# Patient Record
Sex: Female | Born: 1975 | Race: Black or African American | Hispanic: No | Marital: Single | Smoking: Never smoker
Health system: Southern US, Community
[De-identification: ages and names within clinical notes are randomized; demographics above are authoritative.]

## PROBLEM LIST (undated history)

## (undated) DIAGNOSIS — D649 Anemia, unspecified: Secondary | ICD-10-CM

---

## 2012-03-27 ENCOUNTER — Encounter (HOSPITAL_COMMUNITY): Payer: Self-pay | Admitting: Emergency Medicine

## 2012-03-27 ENCOUNTER — Emergency Department (HOSPITAL_COMMUNITY)
Admission: EM | Admit: 2012-03-27 | Discharge: 2012-03-27 | Disposition: A | Discharge status: Home / Self Care | Payer: PRIVATE HEALTH INSURANCE | Attending: Emergency Medicine | Admitting: Emergency Medicine

## 2012-03-27 DIAGNOSIS — A499 Bacterial infection, unspecified: Secondary | ICD-10-CM | POA: Insufficient documentation

## 2012-03-27 DIAGNOSIS — Z202 Contact with and (suspected) exposure to infections with a predominantly sexual mode of transmission: Secondary | ICD-10-CM

## 2012-03-27 DIAGNOSIS — Z9189 Other specified personal risk factors, not elsewhere classified: Secondary | ICD-10-CM | POA: Insufficient documentation

## 2012-03-27 DIAGNOSIS — B379 Candidiasis, unspecified: Secondary | ICD-10-CM

## 2012-03-27 DIAGNOSIS — B49 Unspecified mycosis: Secondary | ICD-10-CM | POA: Insufficient documentation

## 2012-03-27 DIAGNOSIS — N760 Acute vaginitis: Secondary | ICD-10-CM | POA: Insufficient documentation

## 2012-03-27 DIAGNOSIS — B9689 Other specified bacterial agents as the cause of diseases classified elsewhere: Secondary | ICD-10-CM | POA: Insufficient documentation

## 2012-03-27 LAB — URINALYSIS, ROUTINE W REFLEX MICROSCOPIC
Bilirubin Urine: NEGATIVE
Glucose, UA: NEGATIVE mg/dL
Hgb urine dipstick: NEGATIVE
Ketones, ur: NEGATIVE mg/dL
Nitrite: NEGATIVE
Protein, ur: NEGATIVE mg/dL
Specific Gravity, Urine: 1.019 (ref 1.005–1.030)
Urobilinogen, UA: 0.2 mg/dL (ref 0.0–1.0)
pH: 5.5 (ref 5.0–8.0)

## 2012-03-27 LAB — POCT PREGNANCY, URINE: Preg Test, Ur: NEGATIVE

## 2012-03-27 LAB — WET PREP, GENITAL: Trich, Wet Prep: NONE SEEN

## 2012-03-27 LAB — URINE MICROSCOPIC-ADD ON

## 2012-03-27 MED ORDER — LIDOCAINE HCL (PF) 1 % IJ SOLN
INTRAMUSCULAR | Status: AC
  Administered 2012-03-27: 08:00:00
  Filled 2012-03-27: qty 5

## 2012-03-27 MED ORDER — CEFTRIAXONE SODIUM 250 MG IJ SOLR
250.0000 mg | Freq: Once | INTRAMUSCULAR | Status: AC
  Administered 2012-03-27: 250 mg via INTRAMUSCULAR
  Filled 2012-03-27: qty 250

## 2012-03-27 MED ORDER — AZITHROMYCIN 250 MG PO TABS
1000.0000 mg | ORAL_TABLET | Freq: Once | ORAL | Status: AC
  Administered 2012-03-27: 1000 mg via ORAL
  Filled 2012-03-27: qty 4

## 2012-03-27 MED ORDER — FLUCONAZOLE 200 MG PO TABS: 200.0000 mg | ORAL_TABLET | Freq: Every day | ORAL | Status: AC

## 2012-03-27 MED ORDER — IBUPROFEN 200 MG PO TABS
600.0000 mg | ORAL_TABLET | Freq: Once | ORAL | Status: AC
  Administered 2012-03-27: 600 mg via ORAL
  Filled 2012-03-27: qty 3

## 2012-03-27 MED ORDER — HYDROCODONE-ACETAMINOPHEN 5-325 MG PO TABS: 1.0000 | ORAL_TABLET | Freq: Four times a day (QID) | ORAL | Status: AC | PRN

## 2012-03-27 MED ORDER — METRONIDAZOLE 500 MG PO TABS
2000.0000 mg | ORAL_TABLET | Freq: Once | ORAL | Status: AC
  Administered 2012-03-27: 2000 mg via ORAL
  Filled 2012-03-27: qty 4

## 2012-03-27 NOTE — ED Provider Notes (Signed)
History     CSN: [id number]  Arrival date & time 03/27/12  1478   First MD Initiated Contact with Patient 03/27/12 [phone]      Chief Complaint  Patient presents with  . Vaginal Discharge  . Vaginal Itching  . Anal Itching    (Consider location/radiation/quality/duration/timing/severity/associated sxs/prior treatment) HPI Comments: Pt w a hx of genial herpes presents to the ED w cc of vaginal irritation and discharge. Onset of symptoms began early Friday night. Described as green/yellow dc w erythematous vulva and rectal area. "the whole area is inflamed and red." Associated with some mild bleeding Friday night, urinary frequency and dysparunia. LMP June 23, normal. Pt denies abdominal pain, nausea, vomiting, fevers, nights sweats chills, back pain, dysuria, hematuria. Pt reports having unprotected sex with her bf who has had unprotected intercourse with other females.  Patient is a 36 y.o. female presenting with vaginal discharge and vaginal itching. The history is provided by the patient.  Vaginal Discharge  Vaginal Itching    History reviewed. No pertinent past medical history.  History reviewed. No pertinent past surgical history.  History reviewed. No pertinent family history.  History  Substance Use Topics  . Smoking status: Never Smoker   . Smokeless tobacco: Not on file  . Alcohol Use: Yes    OB History    Grav Para Term Preterm Abortions TAB SAB Ect Mult Living   3 3              Review of Systems  Genitourinary: Positive for vaginal discharge.    Allergies  Review of patient's allergies indicates no known allergies.  Home Medications   Current Outpatient Rx  Name Route Sig Dispense Refill  . VALACYCLOVIR HCL 500 MG PO TABS Oral Take 500 mg by mouth daily.      BP 111/69  Pulse 81  Temp 98.5 F (36.9 C) (Oral)  Resp 14  SpO2 97%  Physical Exam  Nursing note and vitals reviewed. Constitutional: She is oriented to person, place, and time. She  appears well-developed and well-nourished. No distress.  HENT:  Head: Normocephalic and atraumatic.  Eyes: Conjunctivae and EOM are normal.  Neck: Normal range of motion.  Pulmonary/Chest: Effort normal.  Genitourinary:       Exam performed by Jaci Carrel,  exam chaperoned Date: 03/27/2012 Pelvic exam: normal external genitalia without evidence of trauma. VAGINA/Vulva: erythematous with discharge, no lesions. CERVIX: cervical motion tenderness absent, cervical os with purulent discharge; vaginal discharge - copious and creamy, Wet prep and DNA probe for chlamydia and GC obtained.   ADNEXA: normal adnexa in size, nontender and no masses UTERUS: uterus is normal size, shape, consistency and nontender.    Musculoskeletal: Normal range of motion.  Neurological: She is alert and oriented to person, place, and time.  Skin: Skin is warm and dry. No rash noted. She is not diaphoretic.  Psychiatric: She has a normal mood and affect. Her behavior is normal.    ED Course  Procedures (including critical care time)  Labs Reviewed  WET PREP, GENITAL - Abnormal; Notable for the following:    Yeast Wet Prep HPF POC MODERATE (*)     Clue Cells Wet Prep HPF POC MODERATE (*)     WBC, Wet Prep HPF POC MODERATE (*)     All other components within normal limits  POCT PREGNANCY, URINE  GC/CHLAMYDIA PROBE AMP, GENITAL  URINALYSIS, ROUTINE W REFLEX MICROSCOPIC   No results found.   No diagnosis found.  MDM  STD Patient to be discharged with instructions to follow up with OBGYN. Discussed importance of using protection when sexually active. Pt understands that they have GC/Chlamydia cultures pending and that they will need to inform all sexual partners if results return positive. Pt has been treated prophylacticly with azithromycin and rocephin due to pts history, pelvic exam, and wet prep with increased WBCs. Pt not concerning for PID because hemodynamically stable and no cervical motion  tenderness on pelvic exam. Pt has also been treated with flagyl for Bacterial Vaginosis. Pt has been advised to not drink alcohol while on this medication. Pt will also be treated with diflucan for yeast seen on wet prep.          Jaci Carrel, [place] 03/27/12 [phone]

## 2012-03-27 NOTE — ED Notes (Signed)
Pt states thick yellow vaginal discharge x 2 weeks, swelling and burning sensation

## 2012-03-27 NOTE — ED Provider Notes (Signed)
Medical screening examination/treatment/procedure(s) were performed by non-physician practitioner and as supervising physician I was immediately available for consultation/collaboration.  Cheri Guppy, MD 03/27/12 1524

## 2012-03-27 NOTE — ED Notes (Signed)
Pt reports unprotected sex with boyfriend who admitted to her that he was having unprotected sex with another female

## 2012-03-28 LAB — GC/CHLAMYDIA PROBE AMP, GENITAL
Chlamydia, DNA Probe: NEGATIVE
GC Probe Amp, Genital: NEGATIVE

## 2012-05-06 ENCOUNTER — Ambulatory Visit (INDEPENDENT_AMBULATORY_CARE_PROVIDER_SITE_OTHER): Payer: PRIVATE HEALTH INSURANCE | Admitting: Family Medicine

## 2012-05-06 ENCOUNTER — Ambulatory Visit: Payer: PRIVATE HEALTH INSURANCE

## 2012-05-06 VITALS — BP 95/60 | HR 66 | Temp 98.1°F | Resp 16 | Ht 66.0 in | Wt 152.0 lb

## 2012-05-06 DIAGNOSIS — M542 Cervicalgia: Secondary | ICD-10-CM

## 2012-05-06 DIAGNOSIS — M79603 Pain in arm, unspecified: Secondary | ICD-10-CM

## 2012-05-06 DIAGNOSIS — G5600 Carpal tunnel syndrome, unspecified upper limb: Secondary | ICD-10-CM

## 2012-05-06 DIAGNOSIS — R202 Paresthesia of skin: Secondary | ICD-10-CM

## 2012-05-06 DIAGNOSIS — M79609 Pain in unspecified limb: Secondary | ICD-10-CM

## 2012-05-06 DIAGNOSIS — G5601 Carpal tunnel syndrome, right upper limb: Secondary | ICD-10-CM

## 2012-05-06 DIAGNOSIS — R209 Unspecified disturbances of skin sensation: Secondary | ICD-10-CM

## 2012-05-06 MED ORDER — NAPROXEN 500 MG PO TABS: 500.0000 mg | ORAL_TABLET | Freq: Two times a day (BID) | ORAL | Status: DC

## 2012-05-06 NOTE — Patient Instructions (Addendum)
1. Arm pain  DG Cervical Spine Complete  2. Neck pain    3. Paresthesias in right hand    4. Carpal tunnel syndrome of right wrist  naproxen (NAPROSYN) 500 MG tablet   Carpal Tunnel Syndrome The carpal tunnel is a narrow hollow area in the wrist. It is formed by the wrist bones and ligaments. Nerves, blood vessels, and tendons (cord like structures which attach muscle to bone) on the palm side (the side of your hand in the direction your fingers bend) of your hand pass through the carpal tunnel. Repeated wrist motion or certain diseases may cause swelling within the tunnel. (That is why these are called repetitive trauma (damage caused by over use) disorders. It is also a common problem in late pregnancy.) This swelling pinches the main nerve in the wrist (median nerve) and causes the painful condition called carpal tunnel syndrome. A feeling of "pins and needles" may be noticed in the fingers or hand; however, the entire arm may ache from this condition. Carpal tunnel syndrome may clear up by itself. Cortisone injections may help. Sometimes, an operation may be needed to free the pinched nerve. An electromyogram (a type of test) may be needed to confirm this diagnosis (learning what is wrong). This is a test which measures nerve conduction. The nerve conduction is usually slowed in a carpal tunnel syndrome. HOME CARE INSTRUCTIONS   If your caregiver prescribed medication to help reduce swelling, take as directed.   If you were given a splint to keep your wrist from bending, use it as instructed. It is important to wear the splint at night. Use the splint for as long as you have pain or numbness in your hand, arm or wrist. This may take 1 to 2 months.   If you have pain at night, it may help to rub or shake your hand, or elevate your hand above the level of your heart (the center of your chest).   It is important to give your wrist a rest by stopping the activities that are causing the problem. If  your symptoms (problems) are work-related, you may need to talk to your employer about changing to a job that does not require using your wrist.   Only take over-the-counter or prescription medicines for pain, discomfort, or fever as directed by your caregiver.   Following periods of extended use, particularly strenuous use, apply an ice pack wrapped in a towel to the anterior (palm) side of the affected wrist for 20 to 30 minutes. Repeat as needed three to four times per day. This will help reduce the swelling.   Follow all instructions for follow-up with your caregiver. This includes any orthopedic referrals, physical therapy, and rehabilitation. Any delay in obtaining necessary care could result in a delay or failure of your condition to heal.  SEEK IMMEDIATE MEDICAL CARE IF:   You are still having pain and numbness following a week of treatment.   You develop new, unexplained symptoms.   Your current symptoms are getting worse and are not helped or controlled with medications.  MAKE SURE YOU:   Understand these instructions.   Will watch your condition.   Will get help right away if you are not doing well or get worse.  Document Released: 08/27/2000 Document Revised: 08/19/2011 Document Reviewed: 07/16/2011 [facility] Patient Information 2012 [place], [place].

## 2012-05-06 NOTE — Progress Notes (Signed)
Subjective:    Patient ID: Patricia Grimes, female    DOB: 30-Apr-1976, 36 y.o.   MRN: [id number]  HPIThis 36 y.o. female presents for evaluation of R arm pain.  Onset 10 days ago.  Noticed it one morning upon awakening and R hand was asleep.  After a while tingling improved.  Started in hand and now radiating into hand.  Also had numbness in toes on R side 1st toe and 2nd, 3rd toe last week; no recurrence since last week.  +mild neck pain with symptoms recently in past few days.  Started in hand and radiated into elbow and now moving into shoulder and neck.  Weakness due to pain in hand R.  Supervisor at work had pt squeeze her; grip was strong.  Picking up objects without weakness.  No similar symptoms in past.  No overuse.  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  Constant intermittent tingling; nighttime tingling intermittent.  All fingers get tingling yet mostly in thumb.  Took generic OTC Ibuprofen without relief; also took Naproxen without relief.   LMP last week;normal.   PMH: HSV genital, regular menses Psurg:  None Allergies: none Medications: Valtrex G3P3 Social: sexually active; +condoms; does not see partner a lot; in school; CNA at [employer].   Family:  M -- Alive 75; hypercholesterolemia   F:  Alive; unknown.  Siblings: healthy  Review of Systems  Constitutional: Negative for fever, chills and fatigue.  HENT: Positive for neck pain and neck stiffness. Negative for tinnitus.   Eyes: Negative for visual disturbance.  Musculoskeletal: Positive for myalgias, back pain and arthralgias. Negative for joint swelling.  Skin: Negative for color change, pallor and rash.  Neurological: Positive for numbness. Negative for dizziness, tremors, syncope, facial asymmetry, weakness and headaches.        No past medical history on file.  No past surgical history on file.  Prior to Admission medications   Medication Sig Start Date End Date Taking? Authorizing Provider    valACYclovir (VALTREX) 500 MG tablet Take 500 mg by mouth daily.   Yes Historical Provider, MD  naproxen (NAPROSYN) 500 MG tablet Take 1 tablet (500 mg total) by mouth 2 (two) times daily with a meal. 05/06/12 05/06/13  Ethelda Chick, MD    No Known Allergies  History   Social History  . Marital Status: Single    Spouse Name: N/A    Number of Children: N/A  . Years of Education: N/A   Occupational History  . Not on file.   Social History Main Topics  . Smoking status: Never Smoker   . Smokeless tobacco: Not on file  . Alcohol Use: Yes  . Drug Use: No  . Sexually Active: Yes    Birth Control/ Protection: None   Other Topics Concern  . Not on file   Social History Narrative  . No narrative on file    No family history on file.  Objective:   Physical Exam  Nursing note and vitals reviewed. Constitutional: She is oriented to person, place, and time. She appears well-developed and well-nourished. No distress.  HENT:  Head: Normocephalic and atraumatic.  Eyes: Conjunctivae and EOM are normal. Pupils are equal, round, and reactive to light.  Neck: Normal range of motion. Neck supple. No thyromegaly present.  Cardiovascular: Normal rate, regular rhythm, normal heart sounds and intact distal pulses.   Pulmonary/Chest: Effort normal and breath sounds normal.  Musculoskeletal:       CERVICAL SPINE: FULL ROM WITHOUT  PAIN; +TTP R TRAPEZIUS REGION; NO MIDLINE TTP CERVICAL SPINE; FULL ROM B SHOULDERS WITHOUT PAIN OR LIMITATION; MOTOR 5/5 BUE; NORMAL PRONATION/SUPINATION RUE; FULL ROM R WRIST; FULL ROM R THUMB; NON-TENDER R HAND. GRIP 5/5.   Lymphadenopathy:    She has no cervical adenopathy.  Neurological: She is alert and oriented to person, place, and time. She has normal reflexes. She exhibits normal muscle tone.       SENSATION INTACT RUE.  Skin: She is not diaphoretic.      [facility] reading (PRIMARY) by  Dr. Katrinka Blazing.  C-spine films:  Spurring/degenerative changes in C5-6.  No  acute changes.   Assessment & Plan:   1. Arm pain  DG Cervical Spine Complete  2. Neck pain    3. Paresthesias in right hand    4. Carpal tunnel syndrome of right wrist  naproxen (NAPROSYN) 500 MG tablet     1.  Arm Pain: New.  Secondary to Carpal Tunnel Syndrome. 2.  Neck Pain:  New.  Appears to be separate issue from arm pain.  S/p cervical spine films with degenerative changes. 3.  Paresthesias Hand R: New. Median nerve distribution suggestive of carpal tunnel syndrome 4.  Carpal Tunnel Syndrome R: New.  Recommend rest, ice, NSAIDs. Provided with wrist splint to use qhs scheduled and PRN during the daytime with work to limit use of R wrist.  No limitations to activities at this time.  If worsens, may warrant referral for NCS and ortho evaluation; pt expressed understanding.  Follow-up one month if no improvement.  Also recommend continuing daily MVI for B6 supplementation.  Meds ordered this encounter  Medications  . naproxen (NAPROSYN) 500 MG tablet    Sig: Take 1 tablet (500 mg total) by mouth 2 (two) times daily with a meal.    Dispense:  30 tablet    Refill:  0

## 2012-05-07 NOTE — Progress Notes (Signed)
Reviewed and agree.

## 2012-08-12 ENCOUNTER — Encounter (HOSPITAL_COMMUNITY): Payer: Self-pay | Admitting: Radiology

## 2012-08-12 ENCOUNTER — Emergency Department (HOSPITAL_COMMUNITY)
Admission: EM | Admit: 2012-08-12 | Discharge: 2012-08-12 | Disposition: A | Discharge status: Home / Self Care | Payer: Self-pay | Attending: Emergency Medicine | Admitting: Emergency Medicine

## 2012-08-12 ENCOUNTER — Ambulatory Visit: Payer: Self-pay

## 2012-08-12 DIAGNOSIS — L678 Other hair color and hair shaft abnormalities: Secondary | ICD-10-CM | POA: Insufficient documentation

## 2012-08-12 DIAGNOSIS — L738 Other specified follicular disorders: Secondary | ICD-10-CM | POA: Insufficient documentation

## 2012-08-12 DIAGNOSIS — L739 Follicular disorder, unspecified: Secondary | ICD-10-CM

## 2012-08-12 MED ORDER — DOXYCYCLINE HYCLATE 100 MG PO CAPS: 100.0000 mg | ORAL_CAPSULE | Freq: Two times a day (BID) | ORAL | Status: DC

## 2012-08-12 NOTE — ED Notes (Signed)
And prescription strength fungal shampoo.

## 2012-08-12 NOTE — ED Notes (Signed)
Pt presents with rash to left occiptal lobe X 1 month

## 2012-08-12 NOTE — ED Provider Notes (Signed)
History     CSN: [id number]  Arrival date & time 08/12/12  1015   First MD Initiated Contact with Patient 08/12/12 1027      Chief Complaint  Patient presents with  . Rash    (Consider location/radiation/quality/duration/timing/severity/associated sxs/prior treatment) HPI  The patient presents to the ED with a 1 month history of a scalp lesion.  The patient reports the lesions started one month ago and has increased in size. She denies new shampoo, condition,hair products, or change of hair dressers.  She complains of pruritis and drainage from the lesion. She reports using ketoconazole over the past month without relief.  She reports brading her hair and wears wigs but has been unable to wear a wig for one month.  Patient denies fever, nausea, vomiting, headache, or weakness.  History reviewed. No pertinent past medical history.  History reviewed. No pertinent past surgical history.  History reviewed. No pertinent family history.  History  Substance Use Topics  . Smoking status: Never Smoker   . Smokeless tobacco: Not on file  . Alcohol Use: Yes    OB History    Grav Para Term Preterm Abortions TAB SAB Ect Mult Living   3 3              Review of Systems All other systems negative except as documented in the HPI. All pertinent positives and negatives as reviewed in the HPI.  Allergies  Review of patient's allergies indicates no known allergies.  Home Medications  No current outpatient prescriptions on file.  BP 116/69  Temp 98.1 F (36.7 C) (Oral)  SpO2 100%  Physical Exam  Nursing note and vitals reviewed. Constitutional: She is oriented to person, place, and time. She appears well-developed and well-nourished.  HENT:  Head: Normocephalic and atraumatic.  Nose: Nose normal.  Mouth/Throat: Oropharynx is clear and moist.  Neck: Normal range of motion. Neck supple.  Pulmonary/Chest: Effort normal.  Neurological: She is alert and oriented to person, place,  and time.  Skin: Rash noted. There is erythema.       5cm x 5cm Erythremic lesion on superior L Scalp with excoriations and 2 pustules with multiple honey colored crusting along superior boarder.     ED Course  Procedures (including critical care time)  There were pustular like areas in the scalp. Will treat for folliculitis and refer to derm. This could be a tinea but there could be a secondary infection. Told to return here as needed.   MDM          Carlyle Dolly, PA-C 08/13/12 [phone]

## 2012-08-13 NOTE — ED Provider Notes (Signed)
Medical screening examination/treatment/procedure(s) were performed by non-physician practitioner and as supervising physician I was immediately available for consultation/collaboration.  Hadyn Azer, MD 08/13/12 0705 

## 2012-11-01 IMAGING — CR DG CERVICAL SPINE COMPLETE 4+V
5 series · 5 of 5 positions shown · non-contrast
Comparison: None.

CLINICAL DATA: Neck/arm pain

CERVICAL SPINE - COMPLETE 4+ VIEW

[lpo]
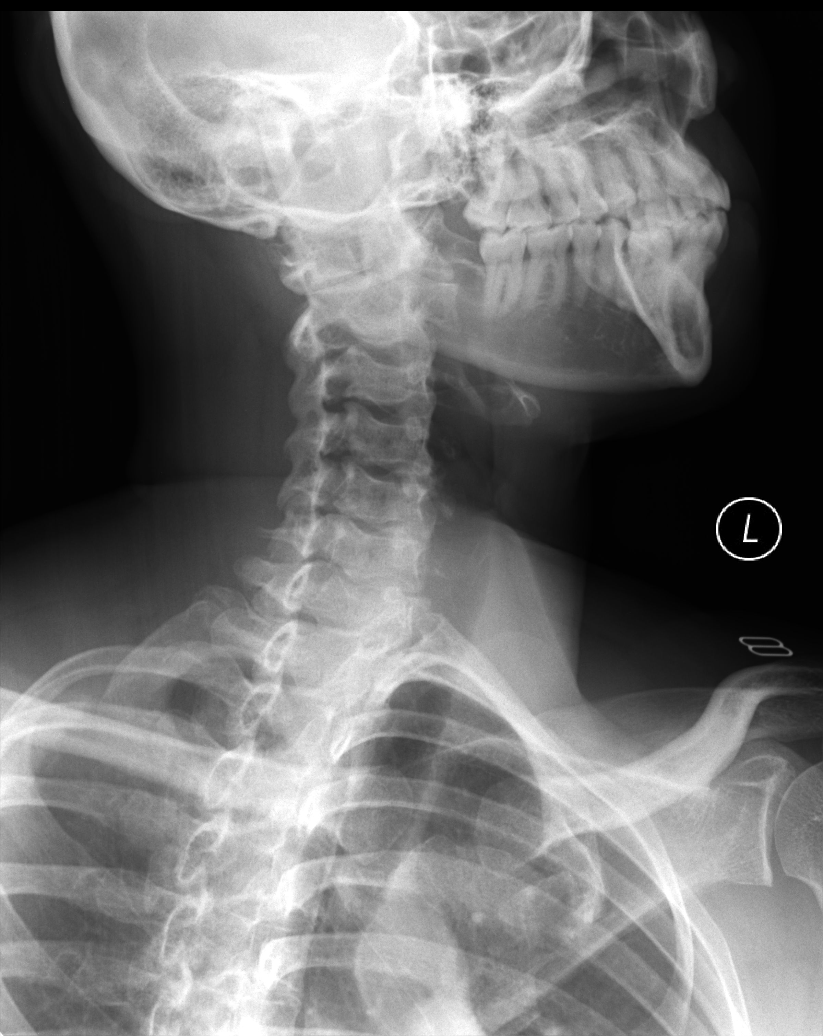

[lateral]
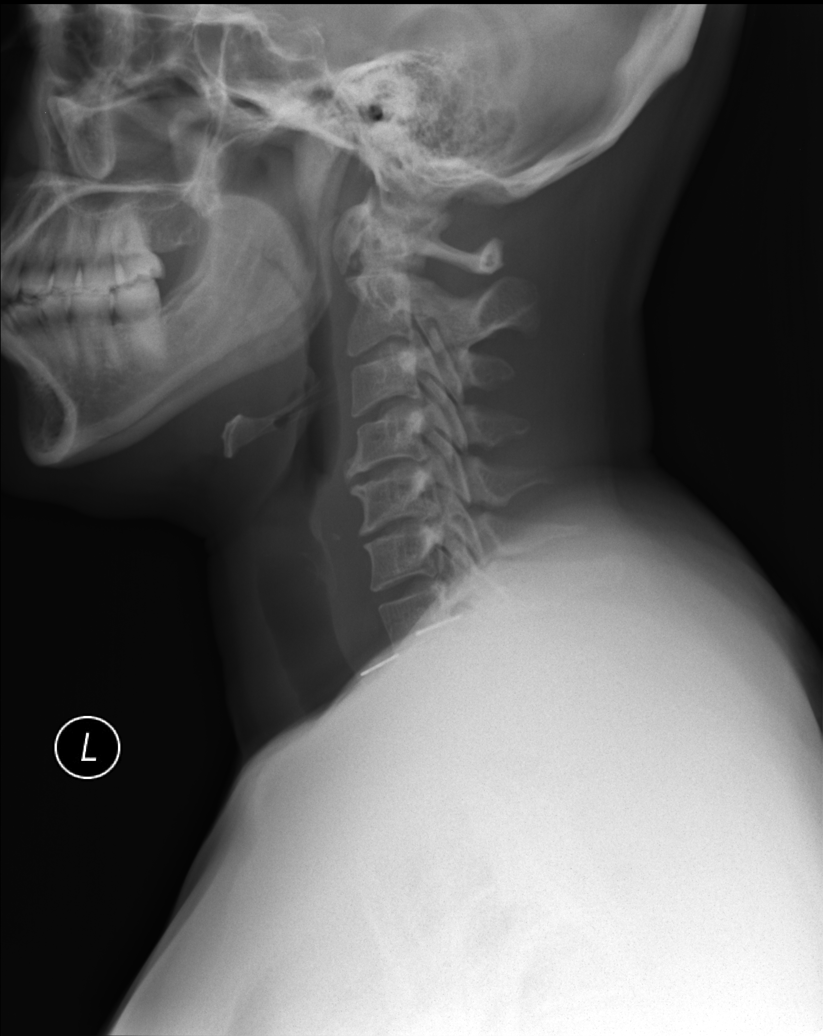

[rpo]
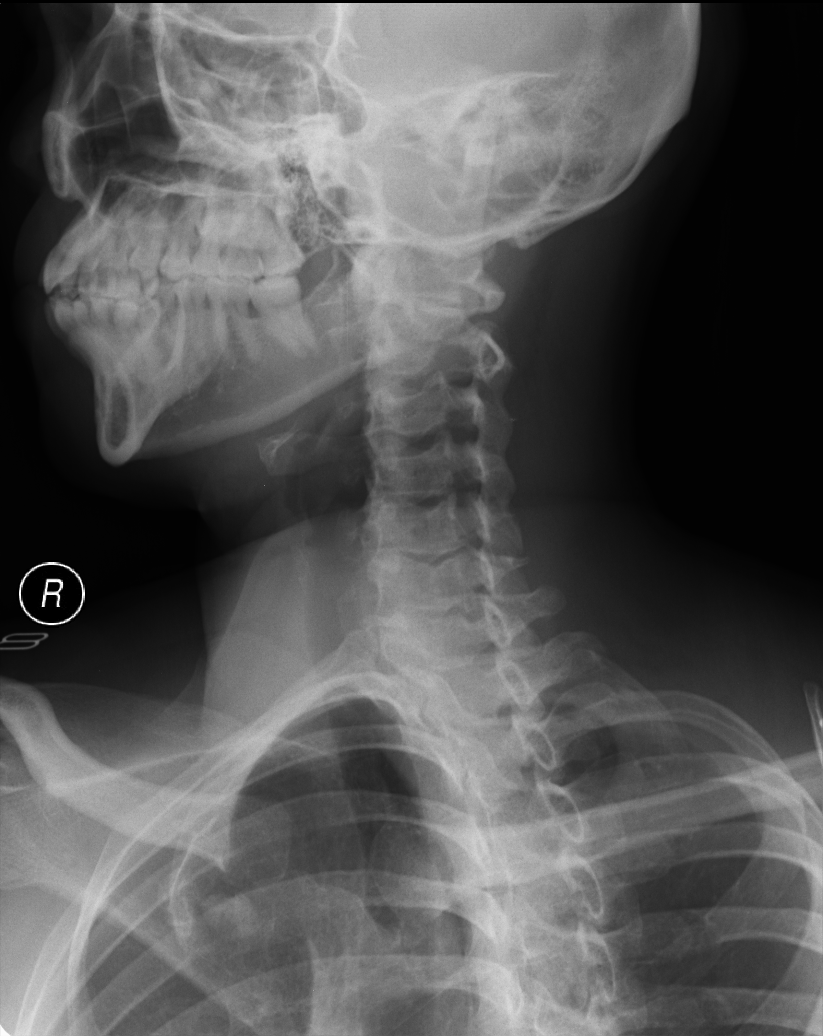

[AP]
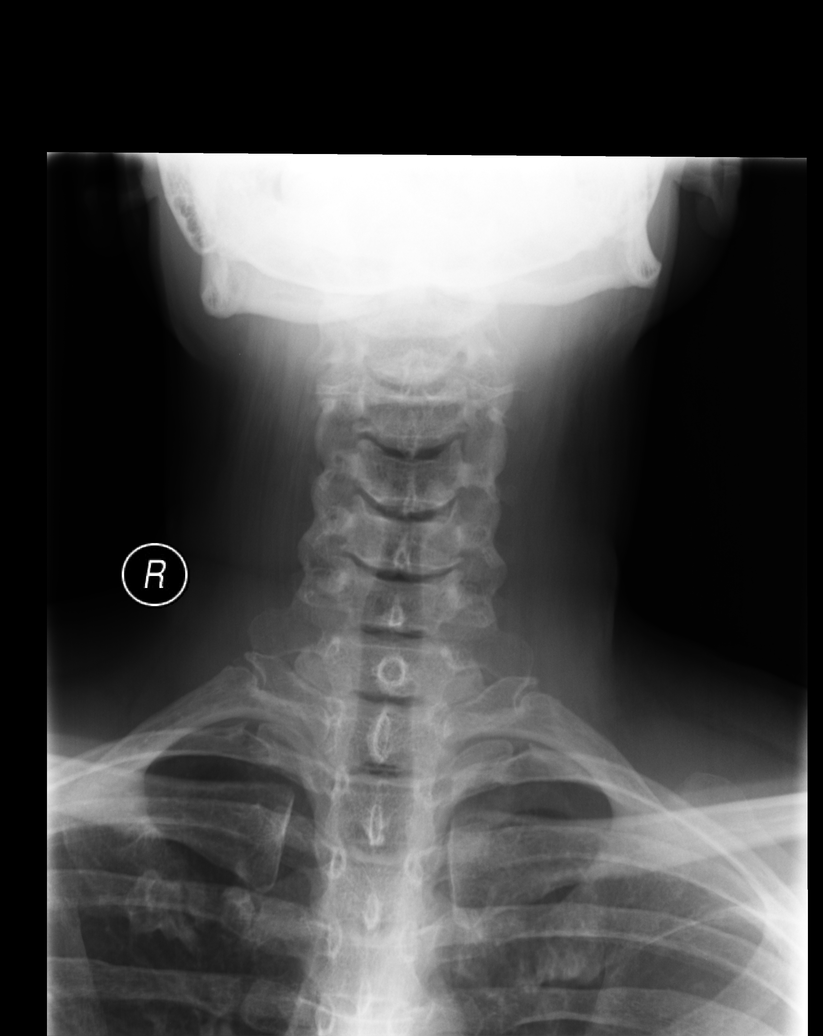

[ap open mouth]
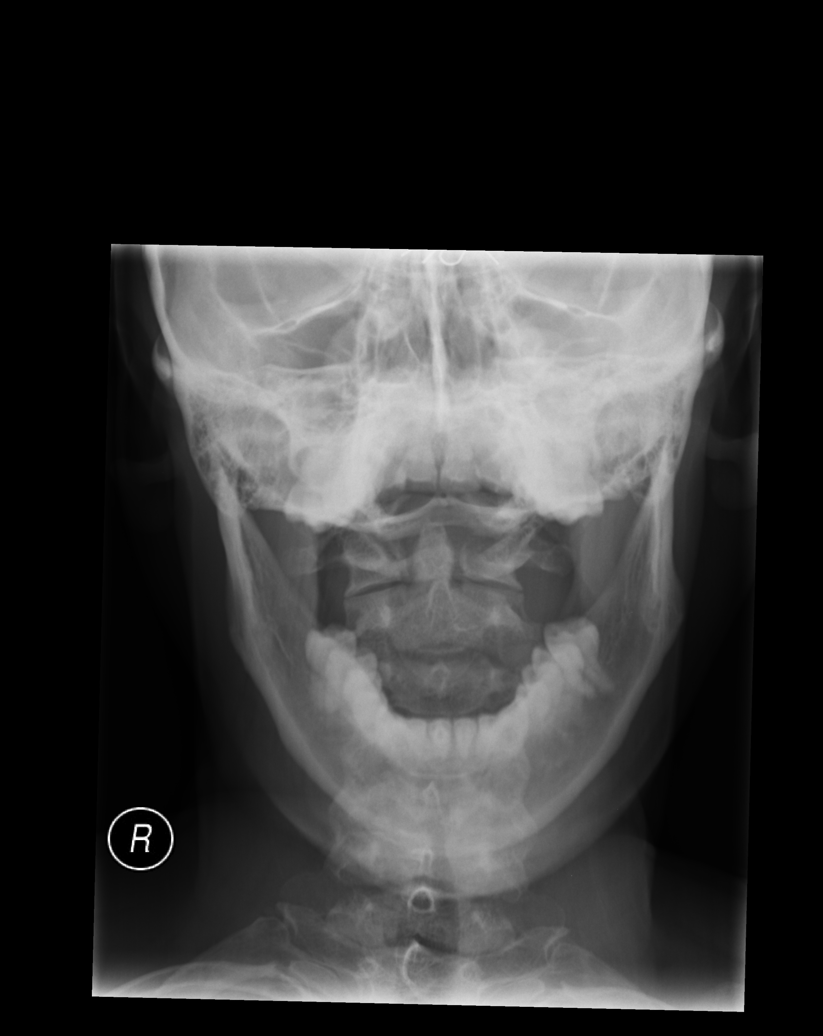

[5 of 5 positions shown; findings below may reference images not displayed]

FINDINGS: Cervical spine is visualized to the bottom of C7 on the
lateral view.

No evidence of fracture or dislocation.  The vertebral body heights
are maintained.  The dens appears intact.  Lateral masses of C1 are
symmetric.

Moderate degenerative changes, including degenerative spurring at
C1-2.

Visualized lung apices are clear.
IMPRESSION: No evidence of fracture or dislocation.

Moderate multilevel degenerative changes.

Clinically significant discrepancy from primary report, if
provided: None

## 2013-08-24 ENCOUNTER — Ambulatory Visit: Payer: 59

## 2013-08-30 ENCOUNTER — Encounter (HOSPITAL_COMMUNITY): Payer: Self-pay | Admitting: Emergency Medicine

## 2013-08-30 DIAGNOSIS — Z79899 Other long term (current) drug therapy: Secondary | ICD-10-CM | POA: Insufficient documentation

## 2013-08-30 DIAGNOSIS — R071 Chest pain on breathing: Secondary | ICD-10-CM | POA: Insufficient documentation

## 2013-08-30 LAB — CBC
HCT: 35.4 % — ABNORMAL LOW (ref 36.0–46.0)
Hemoglobin: 11.8 g/dL — ABNORMAL LOW (ref 12.0–15.0)
MCH: 29.1 pg (ref 26.0–34.0)
MCHC: 33.3 g/dL (ref 30.0–36.0)
MCV: 87.2 fL (ref 78.0–100.0)
Platelets: 256 10*3/uL (ref 150–400)
RBC: 4.06 MIL/uL (ref 3.87–5.11)
RDW: 13.5 % (ref 11.5–15.5)
WBC: 8.3 10*3/uL (ref 4.0–10.5)

## 2013-08-30 LAB — POCT I-STAT TROPONIN I: Troponin i, poc: 0 ng/mL (ref 0.00–0.08)

## 2013-08-30 NOTE — ED Notes (Signed)
Pt. reports intermittent mid/left chest pain onset yesterday with SOB , denies nausea or diaphoresis .

## 2013-08-31 ENCOUNTER — Emergency Department (HOSPITAL_COMMUNITY)
Admission: EM | Admit: 2013-08-31 | Discharge: 2013-08-31 | Disposition: A | Discharge status: Home / Self Care | Payer: 59 | Attending: Emergency Medicine | Admitting: Emergency Medicine

## 2013-08-31 ENCOUNTER — Emergency Department (HOSPITAL_COMMUNITY): Payer: 59

## 2013-08-31 DIAGNOSIS — R0789 Other chest pain: Secondary | ICD-10-CM

## 2013-08-31 LAB — BASIC METABOLIC PANEL
BUN: 11 mg/dL (ref 6–23)
CO2: 24 mEq/L (ref 19–32)
Calcium: 9 mg/dL (ref 8.4–10.5)
Chloride: 105 mEq/L (ref 96–112)
Creatinine, Ser: 0.77 mg/dL (ref 0.50–1.10)
GFR calc Af Amer: >90 mL/min (ref >90)
GFR calc non Af Amer: >90 mL/min (ref >90)
Glucose, Bld: 98 mg/dL (ref 70–99)
Potassium: 3.8 mEq/L (ref 3.5–5.1)
Sodium: 138 mEq/L (ref 135–145)

## 2013-08-31 LAB — PRO B NATRIURETIC PEPTIDE: Pro B Natriuretic peptide (BNP): 19.2 pg/mL (ref 0–125)

## 2013-08-31 LAB — HCG, SERUM, QUALITATIVE: Preg, Serum: NEGATIVE

## 2013-08-31 LAB — D-DIMER, QUANTITATIVE: D-Dimer, Quant: <0.27 ug/mL-FEU (ref 0.00–0.48)

## 2013-08-31 MED ORDER — HYDROCODONE-ACETAMINOPHEN 5-325 MG PO TABS: 2.0000 | ORAL_TABLET | Freq: Every evening | ORAL | Status: DC | PRN

## 2013-08-31 MED ORDER — HYDROCODONE-ACETAMINOPHEN 5-325 MG PO TABS
2.0000 | ORAL_TABLET | Freq: Once | ORAL | Status: AC
  Administered 2013-08-31: 2 via ORAL
  Filled 2013-08-31: qty 2

## 2013-08-31 NOTE — ED Provider Notes (Signed)
CSN: [id number]     Arrival date & time 08/30/13  2316 History   First MD Initiated Contact with Patient 08/31/13 0048     Chief Complaint  Patient presents with  . Chest Pain   (Consider location/radiation/quality/duration/timing/severity/associated sxs/prior Treatment) HPI 37 year old female takes birth control for the last month, has a couple days moderate constant left anterior chest wall tenderness positional reproducible nonexertional slightly pleuritic nonradiating no associated symptoms. No trauma, leg pain, immobilization, or prior DVT/PE. No treatment PTA. History reviewed. No pertinent past medical history. History reviewed. No pertinent past surgical history. No family history on file. History  Substance Use Topics  . Smoking status: Never Smoker   . Smokeless tobacco: Not on file  . Alcohol Use: Yes   OB History   Grav Para Term Preterm Abortions TAB SAB Ect Mult Living   3 3             Review of Systems 10 Systems reviewed and are negative for acute change except as noted in the HPI. Allergies  Review of patient's allergies indicates no known allergies.  Home Medications   Current Outpatient Rx  Name  Route  Sig  Dispense  Refill  . MINASTRIN 24 FE 1-20 MG-MCG(24) CHEW   Oral   Chew 1 tablet by mouth daily.         Marland Kitchen HYDROcodone-acetaminophen (NORCO) 5-325 MG per tablet   Oral   Take 2 tablets by mouth at bedtime as needed for severe pain.   6 tablet   0    BP 110/70  Pulse 72  Temp(Src) 98.5 F (36.9 C) (Oral)  Resp 20  Wt 164 lb (74.39 kg)  SpO2 99%  LMP 08/22/2013 Physical Exam  Nursing note and vitals reviewed. Constitutional:  Awake, alert, nontoxic appearance.  HENT:  Head: Atraumatic.  Eyes: Right eye exhibits no discharge. Left eye exhibits no discharge.  Neck: Neck supple.  Cardiovascular: Normal rate and regular rhythm.   Pulmonary/Chest: Effort normal and breath sounds normal. No respiratory distress. She has no wheezes. She  has no rales. She exhibits tenderness.  Reproducible tenderness left chest  Abdominal: Soft. Bowel sounds are normal. She exhibits no distension. There is no tenderness. There is no rebound and no guarding.  Musculoskeletal: She exhibits no edema and no tenderness.  Baseline ROM, no obvious new focal weakness.  Neurological:  Mental status and motor strength appears baseline for patient and situation.  Skin: No rash noted.  Psychiatric: She has a normal mood and affect.    ED Course  Procedures (including critical care time) Patient / Family / Caregiver informed of clinical course, understand medical decision-making process, and agree with plan. Labs Review Labs Reviewed  CBC - Abnormal; Notable for the following:    Hemoglobin 11.8 (*)    HCT 35.4 (*)    All other components within normal limits  BASIC METABOLIC PANEL  PRO B NATRIURETIC PEPTIDE  D-DIMER, QUANTITATIVE  HCG, SERUM, QUALITATIVE  POCT I-STAT TROPONIN I   Imaging Review Dg Chest 2 View  08/31/2013   CLINICAL DATA:  Chest pain  EXAM: CHEST  2 VIEW  COMPARISON:  None.  FINDINGS: The heart size and mediastinal contours are within normal limits. Both lungs are clear. No pleural effusion. The visualized skeletal structures are unremarkable.  IMPRESSION: No active cardiopulmonary disease.   Electronically Signed   By: Tiburcio Pea M.D.   On: 08/31/2013 02:11    EKG Interpretation    Date/Time:  Thursday August 30 2013 23:21:29 EST Ventricular Rate:  84 PR Interval:  152 QRS Duration: 78 QT Interval:  382 QTC Calculation: 451 R Axis:   76 Text Interpretation:  Normal sinus rhythm Normal ECG No previous ECGs available Confirmed by [facility]  MD, Lashonne Shull ([id number]) on 08/31/2013 12:50:04 AM            MDM   1. Chest wall pain    I doubt any other EMC precluding discharge at this time including, but not necessarily limited to the following:MI, PE.    Hurman Horn, MD 08/31/13 [phone]

## 2014-07-15 ENCOUNTER — Encounter (HOSPITAL_COMMUNITY): Payer: Self-pay | Admitting: Emergency Medicine

## 2015-03-06 ENCOUNTER — Encounter (HOSPITAL_COMMUNITY): Payer: Self-pay | Admitting: Family Medicine

## 2015-03-06 ENCOUNTER — Emergency Department (INDEPENDENT_AMBULATORY_CARE_PROVIDER_SITE_OTHER)
Admission: EM | Admit: 2015-03-06 | Discharge: 2015-03-06 | Disposition: A | Discharge status: Home / Self Care | Payer: Worker's Compensation | Source: Home / Self Care | Attending: Family Medicine | Admitting: Family Medicine

## 2015-03-06 DIAGNOSIS — S90122A Contusion of left lesser toe(s) without damage to nail, initial encounter: Secondary | ICD-10-CM

## 2015-03-06 NOTE — ED Notes (Signed)
Pt states that a resident at her job rolled over her great toe on the left foot with their wheel chair

## 2015-03-06 NOTE — ED Provider Notes (Signed)
CSN: [id number]     Arrival date & time 03/06/15  1853 History   First MD Initiated Contact with Patient 03/06/15 1918     No chief complaint on file.  (Consider location/radiation/quality/duration/timing/severity/associated sxs/prior Treatment) HPI  06:30 today pt sustained a toe injury. 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 Felt like it pulled the toenail up. Burning sensation. Aleve at 08:00 w/ some improvement. Now with intermittent throbbing. Unable to tolerate closed toed shoes.  Denies any loss of sensation in the toe, falls, bleeding from the toe or toenail.    History reviewed. No pertinent past medical history. History reviewed. No pertinent past surgical history. Family History  Problem Relation Age of Onset  . Asthma Neg Hx   . Cancer Neg Hx   . Diabetes Neg Hx   . Heart failure Neg Hx   . Hyperlipidemia Neg Hx   . Hypertension Neg Hx    History  Substance Use Topics  . Smoking status: Never Smoker   . Smokeless tobacco: Not on file  . Alcohol Use: Yes   OB History    Gravida Para Term Preterm AB TAB SAB Ectopic Multiple Living   3 3             Review of Systems Per HPI with all other pertinent systems negative.   Allergies  Review of patient's allergies indicates no known allergies.  Home Medications   Prior to Admission medications   Medication Sig Start Date End Date Taking? Authorizing Provider  MINASTRIN 24 FE 1-20 MG-MCG(24) CHEW Chew 1 tablet by mouth daily. 08/21/13   Historical Provider, MD   BP 118/84 mmHg  Pulse 78  Temp(Src) 98.3 F (36.8 C) (Oral)  Resp 16  SpO2 97% Physical Exam Physical Exam  Constitutional: oriented to person, place, and time. appears well-developed and well-nourished. No distress.  HENT:  Head: Normocephalic and atraumatic.  Eyes: EOMI. PERRL.  Neck: Normal range of motion.  Cardiovascular: RRR, no m/r/g, 2+ distal pulses,  Pulmonary/Chest: Effort normal and breath  sounds normal. No respiratory distress.  Abdominal: Soft. Bowel sounds are normal. NonTTP, no distension.  Musculoskeletal: Left great toe with intermittent tenderness to palpation along the distal third, lateral aspect of the great toenail pulled away from underlying nailbed but now has thick white appearance and this is likely from an old injury. No evidence of fracture .  Neurological: alert and oriented to person, place, and time.  Skin: Skin is warm. No rash noted. non diaphoretic.  Psychiatric: normal mood and affect. behavior is normal. Judgment and thought content normal.   ED Course  Procedures (including critical care time) Labs Review Labs Reviewed - No data to display  Imaging Review No results found.   MDM   1. Toe contusion, left, initial encounter    Mild contusion. Rest, elevation, ice, anti-inflammatories. Work note provided for 1 night.    Ozella Rocks, MD 03/06/15 [phone]

## 2015-03-06 NOTE — Discharge Instructions (Signed)
You bruised your great toe.  Please either take 2 Aleve twice a day or 800mg  of ibuprofen every 6-8hrs.  Please rest your foot and ice the toe intermittently tonight. There is no evidence of fracture

## 2018-04-23 ENCOUNTER — Emergency Department (HOSPITAL_COMMUNITY)
Admission: EM | Admit: 2018-04-23 | Discharge: 2018-04-23 | Disposition: A | Discharge status: Home / Self Care | Payer: Self-pay | Attending: Emergency Medicine | Admitting: Emergency Medicine

## 2018-04-23 ENCOUNTER — Encounter (HOSPITAL_COMMUNITY): Payer: Self-pay | Admitting: *Deleted

## 2018-04-23 ENCOUNTER — Other Ambulatory Visit: Payer: Self-pay

## 2018-04-23 DIAGNOSIS — N644 Mastodynia: Secondary | ICD-10-CM | POA: Insufficient documentation

## 2018-04-23 DIAGNOSIS — N6310 Unspecified lump in the right breast, unspecified quadrant: Secondary | ICD-10-CM | POA: Insufficient documentation

## 2018-04-23 DIAGNOSIS — N63 Unspecified lump in unspecified breast: Secondary | ICD-10-CM

## 2018-04-23 NOTE — Discharge Instructions (Signed)
On your breast exam your breast tissue does feel a bit more dense in the area where you are having tenderness, please contact the breast center, to set up a mammogram and/or ultrasound for your breast.  I would also like free to contact your OB/GYN doctor for follow-up and continued management.  You may use ice and warm compresses, ibuprofen and Tylenol as needed for discomfort.  If you develop redness over the breast, fevers or chills, or any chest pain or shortness of breath return to the ED for reevaluation.

## 2018-04-23 NOTE — ED Triage Notes (Signed)
For 4 days she has had lumps in her rt breast  No redness or swelling no nipple discharge

## 2018-04-23 NOTE — ED Provider Notes (Signed)
[facility]   CSN: [id number] Arrival date & time: 04/23/18  1934     History   Chief Complaint Chief Complaint  Patient presents with  . Breast Mass    HPI Patricia Grimes is a 42 y.o. female.  Patricia Grimes is a 42 y.o. Female who is otherwise healthy, presents to the emergency department for evaluation of right breast pain and a lump that she noticed about 4 days ago.  Patient reports she does not do regular breast exams, but noticed some tenderness over the outer lower portion of the right breast for few days ago, and when she pressed on the area she felt like there was a lump in the right outer area.  She has not noticed any redness or skin changes over the breast, no swelling.  No nipple discharge.  She has not recently been pregnant and is not breast-feeding.  Her last menstrual cycle was at the very end of July, and she is not currently on birth control.  No prior history of breast issues or breast cancer and no family history.  Patient denies any fevers or chills.  No pain in the chest, no shortness of breath or pain with deep breath.  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  Patient does have an OB/GYN she follows with regularly, is supposed to start having mammograms this year but has not yet scheduled her's.     History reviewed. No pertinent past medical history.  There are no active problems to display for this patient.   History reviewed. No pertinent surgical history.   OB History    Gravida  3   Para  3   Term      Preterm      AB      Living        SAB      TAB      Ectopic      Multiple      Live Births               Home Medications    Prior to Admission medications   Medication Sig Start Date End Date Taking? Authorizing Provider  MINASTRIN 24 FE 1-20 MG-MCG(24) CHEW Chew 1 tablet by mouth daily. 08/21/13   [provider]    Family History Family History  Problem Relation Age of Onset  . Asthma Neg Hx   . Cancer Neg Hx   . Diabetes Neg Hx   . Heart failure Neg Hx   . Hyperlipidemia Neg Hx   . Hypertension Neg Hx     Social History Social History   Tobacco Use  . Smoking status: Never Smoker  . Smokeless tobacco: Never Used  Substance Use Topics  . Alcohol use: Yes  . Drug use: No     Allergies   Patient has no known allergies.   Review of Systems Review of Systems  Constitutional: Negative for chills and fever.  Respiratory: Negative for cough, chest tightness, shortness of breath and wheezing.   Cardiovascular: Negative for chest pain.  Gastrointestinal: Negative for abdominal pain.  Musculoskeletal: Negative for arthralgias and myalgias.  Skin: Negative for color change, rash and wound.  All other systems reviewed and are negative.    Physical Exam Updated Vital Signs BP 118/71   Pulse 83   Temp 99 F (37.2 C)  Resp 16   Ht 5\' 6"  (1.676 m)   Wt 73.9 kg   LMP 03/23/2018   SpO2 100%   BMI 26.31 kg/m   Physical Exam  Constitutional: She appears well-developed and well-nourished. No distress.  HENT:  Head: Normocephalic and atraumatic.  Eyes: Right eye exhibits no discharge. Left eye exhibits no discharge.  Neck: Neck supple.  Cardiovascular: Normal rate, regular rhythm, normal heart sounds and intact distal pulses.  Pulmonary/Chest: Effort normal and breath sounds normal. No stridor. No respiratory distress. She has no wheezes. She has no rales. She exhibits tenderness.  Respirations equal and unlabored, patient able to speak in full sentences, lungs clear to auscultation bilaterally, there is tenderness over the right breast in the lower lateral quadrant, there is no palpable discrete mass, but breast tissue does feel more dense on palpation here.  Patient also has some tenderness in the medial lower quadrant without palpable abnormality, no overlying  skin changes, no erythema or fluctuance, no expressible nipple discharge.    Abdominal: Soft. Bowel sounds are normal. She exhibits no distension and no mass. There is no tenderness. There is no guarding.  Neurological: She is alert. Coordination normal.  Skin: Skin is warm and dry. She is not diaphoretic.  Psychiatric: She has a normal mood and affect. Her behavior is normal.  Nursing note and vitals reviewed.    ED Treatments / Results  Labs (all labs ordered are listed, but only abnormal results are displayed) Labs Reviewed - No data to display  EKG None  Radiology No results found.  Procedures Procedures (including critical care time)  Medications Ordered in ED Medications - No data to display   Initial Impression / Assessment and Plan / ED Course  I have reviewed the triage vital signs and the nursing notes.  Pertinent labs & imaging results that were available during my care of the patient were reviewed by me and considered in my medical decision making (see chart for details).  Patient presents for evaluation of right breast tenderness and palpable lumps.  No overlying skin changes, redness, fluctuance, swelling or nipple discharge.  No prior history of breast cancer.  Not currently on her menstrual cycle, not on any hormone therapies.  Patient has never had a mammogram before.  No associated chest pain or shortness of breath.  No injury or trauma to the area.  On exam she has some tenderness over the lower breast, with some increased tissue density in the lateral lower quadrant.  No concern for breast abscess, patient with normal vitals and in no acute distress.  Will refer to the breast center, she should also follow-up with her OB/GYN.  Encouraged anti-inflammatories, ice and heat for discomfort.  Return precautions discussed.  Patient expresses understanding and is in agreement with this plan.  Final Clinical Impressions(s) / ED Diagnoses   Final diagnoses:  Breast  pain  Breast lump in female    ED Discharge Orders    None       Legrand Rams 04/23/18 2258    Sabas Sous, MD 04/24/18 0130

## 2021-01-16 ENCOUNTER — Encounter (HOSPITAL_BASED_OUTPATIENT_CLINIC_OR_DEPARTMENT_OTHER): Payer: Self-pay

## 2021-01-16 ENCOUNTER — Emergency Department (HOSPITAL_BASED_OUTPATIENT_CLINIC_OR_DEPARTMENT_OTHER): Payer: PRIVATE HEALTH INSURANCE

## 2021-01-16 ENCOUNTER — Other Ambulatory Visit: Payer: Self-pay

## 2021-01-16 ENCOUNTER — Emergency Department (HOSPITAL_BASED_OUTPATIENT_CLINIC_OR_DEPARTMENT_OTHER)
Admission: EM | Admit: 2021-01-16 | Discharge: 2021-01-16 | Disposition: A | Discharge status: Home / Self Care | Payer: PRIVATE HEALTH INSURANCE | Attending: Emergency Medicine | Admitting: Emergency Medicine

## 2021-01-16 DIAGNOSIS — D649 Anemia, unspecified: Secondary | ICD-10-CM | POA: Diagnosis not present

## 2021-01-16 DIAGNOSIS — R1012 Left upper quadrant pain: Secondary | ICD-10-CM | POA: Diagnosis present

## 2021-01-16 DIAGNOSIS — K2900 Acute gastritis without bleeding: Secondary | ICD-10-CM | POA: Diagnosis not present

## 2021-01-16 HISTORY — DX: Anemia, unspecified: D64.9

## 2021-01-16 LAB — COMPREHENSIVE METABOLIC PANEL
ALT: 16 U/L (ref 0–44)
AST: 17 U/L (ref 15–41)
Albumin: 3.9 g/dL (ref 3.5–5.0)
Alkaline Phosphatase: 61 U/L (ref 38–126)
Anion gap: 7 (ref 5–15)
BUN: 10 mg/dL (ref 6–20)
CO2: 24 mmol/L (ref 22–32)
Calcium: 9.1 mg/dL (ref 8.9–10.3)
Chloride: 107 mmol/L (ref 98–111)
Creatinine, Ser: 0.81 mg/dL (ref 0.44–1.00)
GFR, Estimated: >60 mL/min (ref >60)
Glucose, Bld: 96 mg/dL (ref 70–99)
Potassium: 3.8 mmol/L (ref 3.5–5.1)
Sodium: 138 mmol/L (ref 135–145)
Total Bilirubin: 0.4 mg/dL (ref 0.3–1.2)
Total Protein: 7 g/dL (ref 6.5–8.1)

## 2021-01-16 LAB — CBC WITH DIFFERENTIAL/PLATELET
Abs Immature Granulocytes: 0.02 10*3/uL (ref 0.00–0.07)
Basophils Absolute: 0 10*3/uL (ref 0.0–0.1)
Basophils Relative: 1 %
Eosinophils Absolute: 0.4 10*3/uL (ref 0.0–0.5)
Eosinophils Relative: 6 %
HCT: 38.4 % (ref 36.0–46.0)
Hemoglobin: 11.9 g/dL — ABNORMAL LOW (ref 12.0–15.0)
Immature Granulocytes: 0 %
Lymphocytes Relative: 17 %
Lymphs Abs: 1.2 10*3/uL (ref 0.7–4.0)
MCH: 26.6 pg (ref 26.0–34.0)
MCHC: 31 g/dL (ref 30.0–36.0)
MCV: 85.9 fL (ref 80.0–100.0)
Monocytes Absolute: 0.6 10*3/uL (ref 0.1–1.0)
Monocytes Relative: 9 %
Neutro Abs: 4.7 10*3/uL (ref 1.7–7.7)
Neutrophils Relative %: 67 %
Platelets: 349 10*3/uL (ref 150–400)
RBC: 4.47 MIL/uL (ref 3.87–5.11)
RDW: 16.7 % — ABNORMAL HIGH (ref 11.5–15.5)
WBC: 7.1 10*3/uL (ref 4.0–10.5)
nRBC: 0 % (ref 0.0–0.2)

## 2021-01-16 LAB — URINALYSIS, ROUTINE W REFLEX MICROSCOPIC
Bilirubin Urine: NEGATIVE
Glucose, UA: NEGATIVE mg/dL
Hgb urine dipstick: NEGATIVE
Ketones, ur: NEGATIVE mg/dL
Leukocytes,Ua: NEGATIVE
Nitrite: NEGATIVE
Protein, ur: NEGATIVE mg/dL
Specific Gravity, Urine: <1.005 — ABNORMAL LOW (ref 1.005–1.030)
pH: 7 (ref 5.0–8.0)

## 2021-01-16 LAB — PREGNANCY, URINE: Preg Test, Ur: NEGATIVE

## 2021-01-16 LAB — LIPASE, BLOOD: Lipase: 39 U/L (ref 11–51)

## 2021-01-16 MED ORDER — ALUM & MAG HYDROXIDE-SIMETH 200-200-20 MG/5ML PO SUSP
30.0000 mL | Freq: Once | ORAL | Status: AC
  Administered 2021-01-16: 30 mL via ORAL
  Filled 2021-01-16: qty 30

## 2021-01-16 MED ORDER — LIDOCAINE VISCOUS HCL 2 % MT SOLN
15.0000 mL | Freq: Once | OROMUCOSAL | Status: AC
  Administered 2021-01-16: 15 mL via ORAL
  Filled 2021-01-16: qty 15

## 2021-01-16 MED ORDER — PANTOPRAZOLE SODIUM 40 MG IV SOLR
40.0000 mg | Freq: Once | INTRAVENOUS | Status: AC
  Administered 2021-01-16: 40 mg via INTRAVENOUS
  Filled 2021-01-16: qty 40

## 2021-01-16 MED ORDER — FENTANYL CITRATE (PF) 100 MCG/2ML IJ SOLN
50.0000 ug | Freq: Once | INTRAMUSCULAR | Status: AC
  Administered 2021-01-16: 50 ug via INTRAVENOUS
  Filled 2021-01-16: qty 2

## 2021-01-16 MED ORDER — LIDOCAINE 5 % EX PTCH
1.0000 | MEDICATED_PATCH | CUTANEOUS | Status: DC
  Administered 2021-01-16: 1 via TRANSDERMAL
  Filled 2021-01-16: qty 1

## 2021-01-16 MED ORDER — IOHEXOL 300 MG/ML  SOLN
100.0000 mL | Freq: Once | INTRAMUSCULAR | Status: AC | PRN
  Administered 2021-01-16: 100 mL via INTRAVENOUS

## 2021-01-16 MED ORDER — PANTOPRAZOLE SODIUM 40 MG PO TBEC: 40.0000 mg | DELAYED_RELEASE_TABLET | Freq: Every day | ORAL | 0 refills | Status: AC

## 2021-01-16 MED ORDER — ONDANSETRON HCL 4 MG/2ML IJ SOLN
4.0000 mg | Freq: Once | INTRAMUSCULAR | Status: AC
  Administered 2021-01-16: 4 mg via INTRAVENOUS
  Filled 2021-01-16: qty 2

## 2021-01-16 NOTE — Discharge Instructions (Addendum)
You werewe seen in the ER today for your abdominal pain. You were found to have gastritis which is inflammation of your stomach, likely secondary to your taking naproxen and ibuprofen together every day.  Please stop taking these medications and other NSAID medications for your shoulder pain.  You may utilize Tylenol or topical pain relief, and may follow-up with Dr. Jordan Likes, sports medicine provider listed below.  You have been prescribed a medication called Protonix to help with the inflammation of your stomach.  Please take this daily for the next 30 days.  Additionally below is contact information for the gastroenterologist with whom you may follow-up as needed.  Return to the emergency department develop any worsening abdominal pain, nausea/vomiting that does not stop, any blood in your vomit or stool, or any other new severe symptoms.

## 2021-01-16 NOTE — ED Provider Notes (Signed)
[facility] EMERGENCY DEPARTMENT Provider Note   CSN: [id number] Arrival date & time: 01/16/21  2831     History Chief Complaint  Patient presents with  . Abdominal Pain    Patricia Grimes is a 45 y.o. female who presents with concern for 4 days of sharp left upper quadrant pain that is worse after p.o. intake.  She reports pain is intermittent but severe, most severe after eating or drinking.  She has tried Tylenol, improvement, antacids, and Mylanta at home without any relief of her pain.  No history of similar pain in the past.  No history of GERD.  She denies any nausea, vomiting, diarrhea, melena, hematochezia, or urinary symptoms.  She does endorse some chills but denies fevers.  I personally reviewed this patient's medical records.  She has history of anemia on iron supplementation.  Also on oral contraceptives daily.  HPI     Past Medical History:  Diagnosis Date  . Anemia     There are no problems to display for this patient.   History reviewed. No pertinent surgical history.   OB History    Gravida  3   Para  3   Term      Preterm      AB      Living        SAB      IAB      Ectopic      Multiple      Live Births              Family History  Problem Relation Age of Onset  . Asthma Neg Hx   . Cancer Neg Hx   . Diabetes Neg Hx   . Heart failure Neg Hx   . Hyperlipidemia Neg Hx   . Hypertension Neg Hx     Social History   Tobacco Use  . Smoking status: Never Smoker  . Smokeless tobacco: Never Used  Substance Use Topics  . Alcohol use: Yes  . Drug use: No    Home Medications Prior to Admission medications   Medication Sig Start Date End Date Taking? Authorizing Provider  pantoprazole (PROTONIX) 40 MG tablet Take 1 tablet (40 mg total) by mouth daily. 01/16/21 02/15/21 Yes Apurva Reily R, PA-C  MINASTRIN 24 FE 1-20 MG-MCG(24) CHEW Chew 1 tablet by mouth daily. 08/21/13   [provider]    Allergies     Patient has no known allergies.  Review of Systems   Review of Systems  Constitutional: Positive for appetite change and chills. Negative for activity change, diaphoresis, fatigue and fever.  HENT: Negative.   Eyes: Negative.   Respiratory: Negative.   Cardiovascular: Negative.   Gastrointestinal: Positive for abdominal pain. Negative for blood in stool, constipation, diarrhea, nausea and vomiting.  Genitourinary: Negative.   Musculoskeletal: Negative.   Skin: Negative.   Neurological: Negative.     Physical Exam Updated Vital Signs BP (!) 102/91   Pulse 65   Temp 98.5 F (36.9 C) (Oral)   Resp 16   Ht 5\' 6"  (1.676 m)   Wt 79.6 kg   LMP 12/11/2020 Comment: Negative u-preg today  SpO2 100%   BMI 28.31 kg/m   Physical Exam Vitals and nursing note reviewed.  Constitutional:      Appearance: She is not ill-appearing or toxic-appearing.  HENT:     Head: Normocephalic and atraumatic.     Nose: Nose normal.     Mouth/Throat:  Mouth: Mucous membranes are moist.     Pharynx: Oropharynx is clear. Uvula midline. No oropharyngeal exudate, posterior oropharyngeal erythema or uvula swelling.     Tonsils: No tonsillar exudate.  Eyes:     General: Lids are normal. Vision grossly intact.        Right eye: No discharge.        Left eye: No discharge.     Extraocular Movements: Extraocular movements intact.     Conjunctiva/sclera: Conjunctivae normal.     Pupils: Pupils are equal, round, and reactive to light.  Neck:     Trachea: Trachea and phonation normal.  Cardiovascular:     Rate and Rhythm: Normal rate and regular rhythm.     Pulses: Normal pulses.     Heart sounds: Normal heart sounds. No murmur heard.   Pulmonary:     Effort: Pulmonary effort is normal. No tachypnea, bradypnea, accessory muscle usage, prolonged expiration or respiratory distress.     Breath sounds: Normal breath sounds. No wheezing or rales.  Chest:     Chest wall: No mass, lacerations,  deformity, swelling, tenderness, crepitus or edema.  Abdominal:     General: Bowel sounds are normal. There is no distension.     Tenderness: There is abdominal tenderness in the epigastric area and left upper quadrant. There is no right CVA tenderness, left CVA tenderness, guarding or rebound. Negative signs include Murphy's sign and McBurney's sign.  Musculoskeletal:        General: No deformity.     Cervical back: Normal range of motion and neck supple. No crepitus. No pain with movement, spinous process tenderness or muscular tenderness.     Right lower leg: No edema.     Left lower leg: No edema.  Lymphadenopathy:     Cervical: No cervical adenopathy.  Skin:    General: Skin is warm and dry.  Neurological:     Mental Status: She is alert and oriented to person, place, and time. Mental status is at baseline.     Sensory: Sensation is intact.     Motor: Motor function is intact.  Psychiatric:        Mood and Affect: Mood normal.     ED Results / Procedures / Treatments   Labs (all labs ordered are listed, but only abnormal results are displayed) Labs Reviewed  CBC WITH DIFFERENTIAL/PLATELET - Abnormal; Notable for the following components:      Result Value   Hemoglobin 11.9 (*)    RDW 16.7 (*)    All other components within normal limits  URINALYSIS, ROUTINE W REFLEX MICROSCOPIC - Abnormal; Notable for the following components:   APPearance HAZY (*)    Specific Gravity, Urine <1.005 (*)    All other components within normal limits  COMPREHENSIVE METABOLIC PANEL  LIPASE, BLOOD  PREGNANCY, URINE    EKG None  Radiology CT Abdomen Pelvis W Contrast  Result Date: 01/16/2021 CLINICAL DATA:  LEFT upper quadrant abdominal pain, sharp, pain with eating or drinking, no relief with Mylanta and antacids EXAM: CT ABDOMEN AND PELVIS WITH CONTRAST TECHNIQUE: Multidetector CT imaging of the abdomen and pelvis was performed using the standard protocol following bolus administration of  intravenous contrast. Sagittal and coronal MPR images reconstructed from axial data set. CONTRAST:  OMNIPAQUE IOHEXOL 300 MG/ML SOLN IV. No oral contrast. COMPARISON:  None FINDINGS: Lower chest: Lung bases clear Hepatobiliary: Gallbladder and liver normal appearance Pancreas: Normal appearance Spleen: Normal appearance Adrenals/Urinary Tract: Adrenal glands, kidneys, ureters, and  bladder normal appearance Stomach/Bowel: Questionable mild wall thickening of gastric antrum distally. No perigastric changes to suggest discrete gastric ulcer or perforation. Remainder of stomach unremarkable. Normal appendix, coiled adjacent to cecal tip. Large and small bowel loops normal appearance. Vascular/Lymphatic: Vascular structures patent. Aorta normal caliber. No adenopathy. Reproductive: Normal appearing uterus and ovaries Other: No free air or free fluid. No hernia or inflammatory process. Musculoskeletal: Unremarkable IMPRESSION: Questionable mild wall thickening of distal gastric antrum, could reflect gastritis , recommend correlation with patient symptoms. Remainder of exam unremarkable. Electronically Signed   By: Ulyses Southward M.D.   On: 01/16/2021 11:54    Procedures Procedures   Medications Ordered in ED Medications  lidocaine (LIDODERM) 5 % 1 patch (1 patch Transdermal Patch Applied 01/16/21 1321)  fentaNYL (SUBLIMAZE) injection 50 mcg (50 mcg Intravenous Given 01/16/21 1038)  ondansetron (ZOFRAN) injection 4 mg (4 mg Intravenous Given 01/16/21 1038)  iohexol (OMNIPAQUE) 300 MG/ML solution 100 mL (100 mLs Intravenous Contrast Given 01/16/21 1113)  pantoprazole (PROTONIX) injection 40 mg (40 mg Intravenous Given 01/16/21 1254)  alum & mag hydroxide-simeth (MAALOX/MYLANTA) 200-200-20 MG/5ML suspension 30 mL (30 mLs Oral Given 01/16/21 1321)    And  lidocaine (XYLOCAINE) 2 % viscous mouth solution 15 mL (15 mLs Oral Given 01/16/21 1321)    ED Course  I have reviewed the triage vital signs and the nursing  notes.  Pertinent labs & imaging results that were available during my care of the patient were reviewed by me and considered in my medical decision making (see chart for details).    MDM Rules/Calculators/A&P                         45 year old female presents with concern for 4 days of progressively worsening left upper quadrant pain without nausea, vomiting, or diarrhea.  Differential diagnosis includes but is not limited to diverticulitis, colitis, GERD, peptic ulcer disease, pancreatitis, splenic infarct/splenic artery aneurysm, pyelonephritis, bowel obstruction, MI, pneumonia, PE, vascular dissection, Herpes zoster.  Vital signs are normal on intake.  Cardiopulmonary exam is normal, abdominal exam is significant for left upper quadrant and epigastric tenderness to palpation without rebound or guarding.  There are no skin changes to suggest herpes zoster.  We will proceed with basic labs and CT of the abdomen pelvis.  CBC with mild anemia at patient baseline. BMP unremarkable, UA negative. Lipase normal.  CT Scan with gastritis.  No further questioning patient admitted to taking naproxen and ibuprofen multiple times a day daily for chronic shoulder pain.  No further work-up warranted in the ED at this time, given reassuring physical exam, vital signs, and clear source for patient's pain. Patient with gastritis secondary to NSAID use.   Will administer dose of Protonix and GI cocktail in the emergency department, and will discharge with course of high-dose Protonix at home.  Patient instructed to discontinue taking NSAID medication and to utilize topical analgesia Tylenol as needed for pain.  Will provide sports medicine follow-up for chronic shoulder pain.  Oney voiced understanding for medical evaluation and treatment plan.  Each of her questions was answered to her expressed satisfaction.  Return precautions given.  Patient stable and appropriate for discharge at this time.  This chart  was dictated using voice recognition software, Dragon. Despite the best efforts of this provider to proofread and correct errors, errors may still occur which can change documentation meaning.  Final Clinical Impression(s) / ED Diagnoses Final diagnoses:  Acute gastritis, presence of  bleeding unspecified, unspecified gastritis type    Rx / DC Orders ED Discharge Orders         Ordered    pantoprazole (PROTONIX) 40 MG tablet  Daily        01/16/21 1307           Candance Bohlman, Eugene GaviaRebekah R, PA-C 01/16/21 1523    Gwyneth SproutPlunkett, Whitney, MD 01/18/21 0745

## 2021-01-16 NOTE — ED Triage Notes (Signed)
Pt reports sharp LUQ pain. States if she eats something or drinks something it hurts. Reports taking Mylanta and antacids with no relief. Denies N/V/D

## 2021-01-19 ENCOUNTER — Ambulatory Visit: Payer: Self-pay

## 2021-01-19 ENCOUNTER — Ambulatory Visit (INDEPENDENT_AMBULATORY_CARE_PROVIDER_SITE_OTHER): Payer: 59 | Admitting: Family Medicine

## 2021-01-19 ENCOUNTER — Encounter: Payer: Self-pay | Admitting: Family Medicine

## 2021-01-19 ENCOUNTER — Other Ambulatory Visit: Payer: Self-pay

## 2021-01-19 VITALS — BP 104/70 | Ht 66.0 in | Wt 175.0 lb

## 2021-01-19 DIAGNOSIS — M7551 Bursitis of right shoulder: Secondary | ICD-10-CM | POA: Diagnosis not present

## 2021-01-19 DIAGNOSIS — M222X1 Patellofemoral disorders, right knee: Secondary | ICD-10-CM

## 2021-01-19 DIAGNOSIS — M222X2 Patellofemoral disorders, left knee: Secondary | ICD-10-CM | POA: Diagnosis not present

## 2021-01-19 DIAGNOSIS — G8929 Other chronic pain: Secondary | ICD-10-CM

## 2021-01-19 MED ORDER — PENNSAID 2 % EX SOLN: 1.0000 "application " | Freq: Two times a day (BID) | CUTANEOUS | 2 refills | Status: AC

## 2021-01-19 MED ORDER — METHYLPREDNISOLONE ACETATE 40 MG/ML IJ SUSP
40.0000 mg | Freq: Once | INTRAMUSCULAR | Status: AC
Start: 2021-01-19 — End: 2021-01-19
  Administered 2021-01-19: 40 mg via INTRA_ARTICULAR

## 2021-01-19 NOTE — Assessment & Plan Note (Signed)
Acute on chronic in nature.  Does have bursitis and cystic changes seen around the shoulder itself.  May have a capsulitis as well due to the chronicity of her symptoms. -Counseled on home exercise therapy and supportive care. -Injection today. -Could consider physical therapy

## 2021-01-19 NOTE — Patient Instructions (Signed)
Nice to meet you Please try heat before exercises and ice after  Please try the rub on medicine as needed   Please send me a message in MyChart with any questions or updates.  Please see me back in 4 weeks.   --Dr. Jordan Likes

## 2021-01-19 NOTE — Assessment & Plan Note (Signed)
Pain most resembles patellofemoral in nature.  No effusion on exam. -Counseled on home exercise therapy and supportive care. -Pennsaid. -Could consider physical therapy or injection.

## 2021-01-19 NOTE — Addendum Note (Signed)
Addended by: Merrilyn Puma on: 01/19/2021 11:15 AM   Modules accepted: Orders

## 2021-01-19 NOTE — Progress Notes (Signed)
Betsie Peckman - 45 y.o. female MRN [id number]  Date of birth: 1976/08/10  SUBJECTIVE:  Including CC & ROS.  No chief complaint on file.   Sherlyn Ebbert is a 45 y.o. female that is presenting with acute on chronic right shoulder pain and bilateral knee pain.  She has been taking ibuprofen for over a year for shoulder pain.  Reports being seen in emergency department and having gastritis due to the ibuprofen.  Also having anterior knee pain   Review of Systems See HPI   HISTORY: Past Medical, Surgical, Social, and Family History Reviewed & Updated per EMR.   Pertinent Historical Findings include:  Past Medical History:  Diagnosis Date  . Anemia     History reviewed. No pertinent surgical history.  Family History  Problem Relation Age of Onset  . Asthma Neg Hx   . Cancer Neg Hx   . Diabetes Neg Hx   . Heart failure Neg Hx   . Hyperlipidemia Neg Hx   . Hypertension Neg Hx     Social History   Socioeconomic History  . Marital status: Single    Spouse name: Not on file  . Number of children: Not on file  . Years of education: Not on file  . Highest education level: Not on file  Occupational History  . Not on file  Tobacco Use  . Smoking status: Never Smoker  . Smokeless tobacco: Never Used  Substance and Sexual Activity  . Alcohol use: Yes  . Drug use: No  . Sexual activity: Yes    Birth control/protection: Pill  Other Topics Concern  . Not on file  Social History Narrative  . Not on file   Social Determinants of Health   Financial Resource Strain: Not on file  Food Insecurity: Not on file  Transportation Needs: Not on file  Physical Activity: Not on file  Stress: Not on file  Social Connections: Not on file  Intimate Partner Violence: Not on file     PHYSICAL EXAM:  VS: BP 104/70 (BP Location: Left Arm, Patient Position: Sitting, Cuff Size: Large)   Ht 5\' 6"  (1.676 m)   Wt 175 lb (79.4 kg)   BMI 28.25 kg/m  Physical Exam Gen: NAD, alert, cooperative  with exam, well-appearing MSK:  Right shoulder: Normal external rotation. Some lack of motion and external rotation abduction. Pain with empty can test. Normal speeds test. Neurovascular intact  Limited ultrasound: Right shoulder:  Normal-appearing biceps tendon.  Cystic change appreciated superficial to the biceps tendon with no hyperemia. Normal-appearing subscapularis. Normal-appearing supraspinatus with overlying subacromial bursitis. Normal-appearing posterior glenohumeral joint. AC joint with degenerative changes and with mild  effusion.  Summary: Subacromial bursitis  Ultrasound and interpretation by , MD   Aspiration/Injection Procedure Note Hideko Esselman August 30, 1976  Procedure: Injection Indications: Right shoulder pain  Procedure Details Consent: Risks of procedure as well as the alternatives and risks of each were explained to the (patient/caregiver).  Consent for procedure obtained. Time Out: Verified patient identification, verified procedure, site/side was marked, verified correct patient position, special equipment/implants available, medications/allergies/relevent history reviewed, required imaging and test results available.  Performed.  The area was cleaned with iodine and alcohol swabs.    The right subacromial space was injected using 1 cc's of 40 mg Depo-Medrol and 4 cc's of 0.25% bupivacaine with a 22 1 1/2" needle.  Ultrasound was used. Images were obtained in long views showing the injection.     A sterile dressing was applied.  Patient  did tolerate procedure well.    ASSESSMENT & PLAN:   Subacromial bursitis of right shoulder joint Acute on chronic in nature.  Does have bursitis and cystic changes seen around the shoulder itself.  May have a capsulitis as well due to the chronicity of her symptoms. -Counseled on home exercise therapy and supportive care. -Injection today. -Could consider physical therapy  Patellofemoral pain  syndrome of both knees Pain most resembles patellofemoral in nature.  No effusion on exam. -Counseled on home exercise therapy and supportive care. -Pennsaid. -Could consider physical therapy or injection.

## 2021-02-16 ENCOUNTER — Encounter: Payer: Self-pay | Admitting: Family Medicine

## 2021-02-16 ENCOUNTER — Other Ambulatory Visit: Payer: Self-pay

## 2021-02-16 ENCOUNTER — Ambulatory Visit (INDEPENDENT_AMBULATORY_CARE_PROVIDER_SITE_OTHER): Payer: 59 | Admitting: Family Medicine

## 2021-02-16 DIAGNOSIS — M222X2 Patellofemoral disorders, left knee: Secondary | ICD-10-CM

## 2021-02-16 DIAGNOSIS — M7551 Bursitis of right shoulder: Secondary | ICD-10-CM

## 2021-02-16 DIAGNOSIS — M222X1 Patellofemoral disorders, right knee: Secondary | ICD-10-CM | POA: Diagnosis not present

## 2021-02-16 NOTE — Progress Notes (Signed)
  Patricia Grimes - 45 y.o. female MRN [id number]  Date of birth: Jan 27, 1976  SUBJECTIVE:  Including CC & ROS.  No chief complaint on file.   Patricia Grimes is a 45 y.o. female that is following up for her shoulder and knee pain.  Her knees have gotten significantly better since working out.  Her shoulder is still giving her pain from time to time.   Review of Systems See HPI   HISTORY: Past Medical, Surgical, Social, and Family History Reviewed & Updated per EMR.   Pertinent Historical Findings include:  Past Medical History:  Diagnosis Date  . Anemia     History reviewed. No pertinent surgical history.  Family History  Problem Relation Age of Onset  . Asthma Neg Hx   . Cancer Neg Hx   . Diabetes Neg Hx   . Heart failure Neg Hx   . Hyperlipidemia Neg Hx   . Hypertension Neg Hx     Social History   Socioeconomic History  . Marital status: Single    Spouse name: Not on file  . Number of children: Not on file  . Years of education: Not on file  . Highest education level: Not on file  Occupational History  . Not on file  Tobacco Use  . Smoking status: Never Smoker  . Smokeless tobacco: Never Used  Substance and Sexual Activity  . Alcohol use: Yes  . Drug use: No  . Sexual activity: Yes    Birth control/protection: Pill  Other Topics Concern  . Not on file  Social History Narrative  . Not on file   Social Determinants of Health   Financial Resource Strain: Not on file  Food Insecurity: Not on file  Transportation Needs: Not on file  Physical Activity: Not on file  Stress: Not on file  Social Connections: Not on file  Intimate Partner Violence: Not on file     PHYSICAL EXAM:  VS: BP 116/72 (BP Location: Left Arm, Patient Position: Sitting, Cuff Size: Large)   Ht 5\' 6"  (1.676 m)   Wt 175 lb (79.4 kg)   BMI 28.25 kg/m  Physical Exam Gen: NAD, alert, cooperative with exam, well-appearing    ASSESSMENT & PLAN:   Patellofemoral pain syndrome of both  knees Has little to no pain and has started working out. -Counseled on home exercise therapy and supportive care. -Could consider physical therapy.  Subacromial bursitis of right shoulder joint Still having some discomfort but pain has gotten improvement since the steroid injection.  May have component of AC joint contributing. -Counseled on home exercise therapy and supportive care. -Could consider AC joint injection physical therapy.

## 2021-02-16 NOTE — Patient Instructions (Signed)
Good to see you Please continue the exercises  Please use ice as needed   Please send me a message in MyChart with any questions or updates.  Please see me back in 6-8 weeks.   --Dr. Jordan Likes

## 2021-02-16 NOTE — Assessment & Plan Note (Signed)
Still having some discomfort but pain has gotten improvement since the steroid injection.  May have component of AC joint contributing. -Counseled on home exercise therapy and supportive care. -Could consider AC joint injection physical therapy.

## 2021-02-16 NOTE — Assessment & Plan Note (Signed)
Has little to no pain and has started working out. -Counseled on home exercise therapy and supportive care. -Could consider physical therapy.

## 2021-03-30 ENCOUNTER — Ambulatory Visit: Payer: 59 | Admitting: Family Medicine

## 2021-03-30 NOTE — Progress Notes (Deleted)
  Patricia Grimes - 45 y.o. female MRN [id number]  Date of birth: 01/05/1976  SUBJECTIVE:  Including CC & ROS.  No chief complaint on file.   Patricia Grimes is a 45 y.o. female that is  ***.  ***   Review of Systems See HPI   HISTORY: Past Medical, Surgical, Social, and Family History Reviewed & Updated per EMR.   Pertinent Historical Findings include:  Past Medical History:  Diagnosis Date   Anemia     No past surgical history on file.  Family History  Problem Relation Age of Onset   Asthma Neg Hx    Cancer Neg Hx    Diabetes Neg Hx    Heart failure Neg Hx    Hyperlipidemia Neg Hx    Hypertension Neg Hx     Social History   Socioeconomic History   Marital status: Single    Spouse name: Not on file   Number of children: Not on file   Years of education: Not on file   Highest education level: Not on file  Occupational History   Not on file  Tobacco Use   Smoking status: Never   Smokeless tobacco: Never  Substance and Sexual Activity   Alcohol use: Yes   Drug use: No   Sexual activity: Yes    Birth control/protection: Pill  Other Topics Concern   Not on file  Social History Narrative   Not on file   Social Determinants of Health   Financial Resource Strain: Not on file  Food Insecurity: Not on file  Transportation Needs: Not on file  Physical Activity: Not on file  Stress: Not on file  Social Connections: Not on file  Intimate Partner Violence: Not on file     PHYSICAL EXAM:  VS: There were no vitals taken for this visit. Physical Exam Gen: NAD, alert, cooperative with exam, well-appearing MSK:  ***      ASSESSMENT & PLAN:   No problem-specific Assessment & Plan notes found for this encounter.

## 2022-06-13 ENCOUNTER — Other Ambulatory Visit: Payer: Self-pay

## 2022-06-13 ENCOUNTER — Encounter (HOSPITAL_BASED_OUTPATIENT_CLINIC_OR_DEPARTMENT_OTHER): Payer: Self-pay | Admitting: Emergency Medicine

## 2022-06-13 ENCOUNTER — Emergency Department (HOSPITAL_BASED_OUTPATIENT_CLINIC_OR_DEPARTMENT_OTHER)
Admission: EM | Admit: 2022-06-13 | Discharge: 2022-06-14 | Disposition: A | Discharge status: Home / Self Care | Payer: PRIVATE HEALTH INSURANCE | Attending: Emergency Medicine | Admitting: Emergency Medicine

## 2022-06-13 DIAGNOSIS — R21 Rash and other nonspecific skin eruption: Secondary | ICD-10-CM | POA: Insufficient documentation

## 2022-06-13 NOTE — ED Notes (Signed)
Patient has a rash between her buttocks cheeks. This has been getting worse over the last 2 or 3 days. Pt has been putting baby cream (diaper rash) on same with no improvement.

## 2022-06-13 NOTE — ED Provider Notes (Signed)
[facility] EMERGENCY DEPARTMENT  Provider Note  CSN: [id number] Arrival date & time: 06/13/22 2226  History Chief Complaint  Patient presents with   Rash    Patricia Grimes is a 46 y.o. female with no significant PMH reports a painful rash in her gluteal cleft for the last several days. She reports she used a new menstrual pad during her period last week but her rash is only in her gluteal cleft. She has been applying desitin barrier cream without much improvement.    Home Medications Prior to Admission medications   Medication Sig Start Date End Date Taking? Authorizing Provider  doxycycline (VIBRAMYCIN) 100 MG capsule Take 1 capsule (100 mg total) by mouth 2 (two) times daily. 06/14/22  Yes Pollyann Savoy, MD  hydrocortisone cream 1 % Apply to affected area 2 times daily 06/14/22  Yes Pollyann Savoy, MD  Diclofenac Sodium (PENNSAID) 2 % SOLN Place 1 application onto the skin 2 (two) times daily. 01/19/21   Myra Rude, MD  MINASTRIN 24 FE 1-20 MG-MCG(24) CHEW Chew 1 tablet by mouth daily. 08/21/13   [provider]  pantoprazole (PROTONIX) 40 MG tablet Take 1 tablet (40 mg total) by mouth daily. 01/16/21 02/15/21  Sponseller, Eugene Gavia, PA-C     Allergies    Patient has no known allergies.   Review of Systems   Review of Systems Please see HPI for pertinent positives and negatives  Physical Exam BP 127/72 (BP Location: Left Arm)   Pulse 73   Temp 98.2 F (36.8 C) (Oral)   Resp 18   Ht 5\' 6"  (1.676 m)   Wt 75.3 kg   LMP 06/03/2022   SpO2 98%   BMI 26.79 kg/m   Physical Exam Vitals and nursing note reviewed.  HENT:     Head: Normocephalic.     Nose: Nose normal.  Eyes:     Extraocular Movements: Extraocular movements intact.  Pulmonary:     Effort: Pulmonary effort is normal.  Genitourinary:    Comments: Chaperone present. There is desitin barrier cream in the area making thorough evaluation limited, however after wiping most of it off,  there is a small area of macerated skin in the midline gluteal cleft. Some surrounding erythema. No vesicles or fluctuance to suggest shingles or abscess.  Musculoskeletal:        General: Normal range of motion.     Cervical back: Neck supple.  Skin:    Findings: No rash (on exposed skin).  Neurological:     Mental Status: She is alert and oriented to person, place, and time.  Psychiatric:        Mood and Affect: Mood normal.     ED Results / Procedures / Treatments   EKG None  Procedures Procedures  Medications Ordered in the ED Medications - No data to display  Initial Impression and Plan  Patient here with a painful rash, does not appear to need drainage. Will try a steroid cream to speed healing in case this is an allergic reaction. She is also having significant pain and some erythema to the area which may indicate an early secondary infection. Add doxycyline. Continue with wound care, recommend PCP follow up for recheck if not improving.   ED Course       MDM Rules/Calculators/A&P Medical Decision Making Problems Addressed: Rash: acute illness or injury  Risk Prescription drug management.    Final Clinical Impression(s) / ED Diagnoses Final diagnoses:  Rash  Rx / DC Orders ED Discharge Orders          Ordered    hydrocortisone cream 1 %        06/14/22 0003    doxycycline (VIBRAMYCIN) 100 MG capsule  2 times daily        06/14/22 0003             Truddie Hidden, MD 06/14/22 0003

## 2022-06-13 NOTE — ED Triage Notes (Signed)
Reports rash to butt crack for the last few days.  Using desitin with no relief.

## 2022-06-13 NOTE — ED Notes (Signed)
ED Provider at bedside. 

## 2022-06-14 MED ORDER — HYDROCORTISONE 1 % EX CREA: TOPICAL_CREAM | CUTANEOUS | 0 refills | Status: AC

## 2022-06-14 MED ORDER — DOXYCYCLINE HYCLATE 100 MG PO CAPS: 100.0000 mg | ORAL_CAPSULE | Freq: Two times a day (BID) | ORAL | 0 refills | Status: AC

## 2022-11-19 ENCOUNTER — Emergency Department (HOSPITAL_BASED_OUTPATIENT_CLINIC_OR_DEPARTMENT_OTHER)
Admission: EM | Admit: 2022-11-19 | Discharge: 2022-11-19 | Disposition: A | Discharge status: Home / Self Care | Payer: BLUE CROSS/BLUE SHIELD | Attending: Emergency Medicine | Admitting: Emergency Medicine

## 2022-11-19 ENCOUNTER — Other Ambulatory Visit: Payer: Self-pay

## 2022-11-19 ENCOUNTER — Encounter (HOSPITAL_BASED_OUTPATIENT_CLINIC_OR_DEPARTMENT_OTHER): Payer: Self-pay

## 2022-11-19 DIAGNOSIS — B379 Candidiasis, unspecified: Secondary | ICD-10-CM | POA: Insufficient documentation

## 2022-11-19 DIAGNOSIS — R21 Rash and other nonspecific skin eruption: Secondary | ICD-10-CM | POA: Diagnosis present

## 2022-11-19 MED ORDER — NYSTATIN-TRIAMCINOLONE 100000-0.1 UNIT/GM-% EX CREA: TOPICAL_CREAM | CUTANEOUS | 0 refills | Status: AC

## 2022-11-19 MED ORDER — CEFADROXIL 500 MG PO CAPS: 500.0000 mg | ORAL_CAPSULE | Freq: Two times a day (BID) | ORAL | 0 refills | Status: AC

## 2022-11-19 NOTE — ED Provider Notes (Signed)
[facility] AT [facility] Provider Note   CSN: [id number] Arrival date & time: 11/19/22  1028     History  Chief Complaint  Patient presents with   Rash    Patricia Grimes is a 47 y.o. female.  With PMH of anemia who presents with painful, pruritic rash in groin region.  Patient was seen a couple months ago for similar.  She is complaining of painful and itchy rash in the groin region and in the buttock region.  She has had no fevers, no chills, no discharge from wound site.  She has been using many different soaps on the area.  She has also been using jock itch over-the-counter lotion on the area but is not getting relief.  She was seen a couple months ago for similar given doxycycline and topical steroids with improvement.  However it is back again and worse than before.   Rash      Home Medications Prior to Admission medications   Medication Sig Start Date End Date Taking? Authorizing Provider  cefadroxil (DURICEF) 500 MG capsule Take 1 capsule (500 mg total) by mouth 2 (two) times daily for 7 days. 11/19/22 11/26/22 Yes Elgie Congo, MD  nystatin-triamcinolone [facility] II) cream Apply to affected area daily 11/19/22  Yes Elgie Congo, MD  Diclofenac Sodium (PENNSAID) 2 % SOLN Place 1 application onto the skin 2 (two) times daily. 01/19/21   Rosemarie Ax, MD  doxycycline (VIBRAMYCIN) 100 MG capsule Take 1 capsule (100 mg total) by mouth 2 (two) times daily. 06/14/22   Truddie Hidden, MD  hydrocortisone cream 1 % Apply to affected area 2 times daily 06/14/22   Truddie Hidden, MD  MINASTRIN 24 FE 1-20 MG-MCG(24) CHEW Chew 1 tablet by mouth daily. 08/21/13   [provider]  pantoprazole (PROTONIX) 40 MG tablet Take 1 tablet (40 mg total) by mouth daily. 01/16/21 02/15/21  Sponseller, Gypsy Balsam, PA-C      Allergies    Patient has no known allergies.    Review of Systems   Review of Systems  Skin:  Positive for rash.     Physical Exam Updated Vital Signs BP 137/83 (BP Location: Right Arm)   Pulse (!) 104   Temp 98.7 F (37.1 C) (Oral)   Resp 18   Ht '5\' 7"'$  (1.702 m)   Wt 72.6 kg   SpO2 98%   BMI 25.06 kg/m  Physical Exam Constitutional: Alert and oriented. Uncomfortable but nontoxic Eyes: Conjunctivae are normal. ENT      Head: Normocephalic and atraumatic. Cardiovascular: regular rate Respiratory: Normal respiratory effort. Gastrointestinal: nondistended Musculoskeletal: Normal range of motion in all extremities. Neurologic: Normal speech and language. No gross focal neurologic deficits are appreciated. Skin: Skin is warm.  Erythematous, flat, shiny rash of bilateral thigh and groin region as well as gluteal cleft.  There are no vesicle lesions.  There is no purulence or discharge.  There is no fluctuance. Psychiatric: Mood and affect are normal. Speech and behavior are normal.  ED Results / Procedures / Treatments   Labs (all labs ordered are listed, but only abnormal results are displayed) Labs Reviewed - No data to display  EKG None  Radiology No results found.  Procedures Procedures    Medications Ordered in ED Medications - No data to display  ED Course/ Medical Decision Making/ A&P   {  Medical Decision Making Patricia Grimes is a 47 y.o. female.  With PMH of anemia who presents with painful, pruritic rash in groin region.  Patient's rash seems most consistent with intertriginous candidal infection.  Discussed with patient discontinuing all scented soaps and using only unscented nonirritating soaps.  Drying region immediately after showering.  Starting nystatin triamcinolone cream which I prescribed today daily and prescribed cefadroxil as needed for possible superimposed bacterial cellulitis although low suspicion.  Risk Prescription drug management.    Final Clinical Impression(s) / ED Diagnoses Final diagnoses:  Rash  Candidiasis     Rx / DC Orders ED Discharge Orders          Ordered    nystatin-triamcinolone (MYCOLOG II) cream        11/19/22 1207    cefadroxil (DURICEF) 500 MG capsule  2 times daily        11/19/22 1207              Elgie Congo, MD 11/20/22 1037

## 2022-11-19 NOTE — ED Notes (Signed)
Pt. Has noted red, raw rash noted in her groin area and the upper butt crack area that has been there for approx. 5 mths per the pt.   Pt. Reports using antifungal creams and still having skin issues.

## 2022-11-19 NOTE — Discharge Instructions (Addendum)
As we discussed, keep the area very clean and dry.  Apply the cream that we have prescribed you daily to help with symptoms.  Continue to use until rash is disappeared.  Although I discussed with you I do not think this is a bacterial infection I have given you a short course of antibiotics.  Use only soaps that are unscented and for sensitive skin.  Follow-up with your primary care doctor regarding your visit to the ER today.  If you are having any worsening symptoms or no improvement, I recommend you see a dermatologist or come back to ER.

## 2022-11-19 NOTE — ED Triage Notes (Signed)
Pt to er, pt states that she is here for a rash in her groin area, states that she has had a contact dermatitis in her groin before, but has since changed pads, but the rash has returned.

## 2022-12-30 ENCOUNTER — Encounter: Payer: Self-pay | Admitting: *Deleted
